# Patient Record
Sex: Female | Born: 1985 | Race: White | Hispanic: No | Marital: Single | Smoking: Current every day smoker
Health system: Southern US, Community
[De-identification: ages and names within clinical notes are randomized; demographics above are authoritative.]

## PROBLEM LIST (undated history)

## (undated) HISTORY — PX: ABDOMINAL HYSTERECTOMY: SHX81

---

## 2004-12-01 ENCOUNTER — Ambulatory Visit: Payer: Self-pay | Admitting: Family Medicine

## 2004-12-30 ENCOUNTER — Ambulatory Visit: Payer: Self-pay | Admitting: Family Medicine

## 2005-02-05 ENCOUNTER — Ambulatory Visit: Payer: Self-pay | Admitting: Family Medicine

## 2005-05-21 ENCOUNTER — Ambulatory Visit: Payer: Self-pay | Admitting: Family Medicine

## 2005-06-22 ENCOUNTER — Ambulatory Visit: Payer: Self-pay | Admitting: Family Medicine

## 2005-09-18 ENCOUNTER — Ambulatory Visit: Payer: Self-pay | Admitting: Family Medicine

## 2005-10-15 ENCOUNTER — Ambulatory Visit: Payer: Self-pay | Admitting: Family Medicine

## 2007-03-18 ENCOUNTER — Ambulatory Visit: Payer: Self-pay | Admitting: Family Medicine

## 2014-12-17 ENCOUNTER — Telehealth: Payer: Self-pay | Admitting: Family Medicine

## 2014-12-17 NOTE — Telephone Encounter (Signed)
Patient aware that our 1st new patient appointment is not until the end of April. Appointment scheduled for 4/19 at 9:10 with Christy.

## 2015-01-22 ENCOUNTER — Ambulatory Visit: Payer: Self-pay | Admitting: Family

## 2015-06-12 ENCOUNTER — Emergency Department (HOSPITAL_COMMUNITY)
Admission: EM | Admit: 2015-06-12 | Discharge: 2015-06-12 | Disposition: A | Discharge status: Home / Self Care | Payer: Self-pay

## 2017-06-09 ENCOUNTER — Ambulatory Visit: Payer: Self-pay | Admitting: Family

## 2017-06-10 ENCOUNTER — Encounter: Payer: Self-pay | Admitting: Family

## 2019-06-16 ENCOUNTER — Encounter (HOSPITAL_COMMUNITY): Payer: Self-pay | Admitting: *Deleted

## 2019-06-16 ENCOUNTER — Emergency Department (HOSPITAL_COMMUNITY): Payer: No Typology Code available for payment source

## 2019-06-16 ENCOUNTER — Emergency Department (HOSPITAL_COMMUNITY)
Admission: EM | Admit: 2019-06-16 | Discharge: 2019-06-16 | Disposition: A | Discharge status: Home / Self Care | Payer: No Typology Code available for payment source | Attending: Emergency Medicine | Admitting: Emergency Medicine

## 2019-06-16 ENCOUNTER — Other Ambulatory Visit: Payer: Self-pay

## 2019-06-16 DIAGNOSIS — S0181XA Laceration without foreign body of other part of head, initial encounter: Secondary | ICD-10-CM | POA: Insufficient documentation

## 2019-06-16 DIAGNOSIS — F17200 Nicotine dependence, unspecified, uncomplicated: Secondary | ICD-10-CM | POA: Insufficient documentation

## 2019-06-16 DIAGNOSIS — S0990XA Unspecified injury of head, initial encounter: Secondary | ICD-10-CM | POA: Diagnosis present

## 2019-06-16 DIAGNOSIS — S02651A Fracture of angle of right mandible, initial encounter for closed fracture: Secondary | ICD-10-CM | POA: Insufficient documentation

## 2019-06-16 DIAGNOSIS — Y92410 Unspecified street and highway as the place of occurrence of the external cause: Secondary | ICD-10-CM | POA: Insufficient documentation

## 2019-06-16 DIAGNOSIS — Y9389 Activity, other specified: Secondary | ICD-10-CM | POA: Insufficient documentation

## 2019-06-16 DIAGNOSIS — Y998 Other external cause status: Secondary | ICD-10-CM | POA: Diagnosis not present

## 2019-06-16 LAB — CBC WITH DIFFERENTIAL/PLATELET
Abs Immature Granulocytes: 0.05 K/uL (ref 0.00–0.07)
Basophils Absolute: 0.1 K/uL (ref 0.0–0.1)
Basophils Relative: 0 %
Eosinophils Absolute: 0.3 K/uL (ref 0.0–0.5)
Eosinophils Relative: 2 %
HCT: 36.3 % (ref 36.0–46.0)
Hemoglobin: 11.4 g/dL — ABNORMAL LOW (ref 12.0–15.0)
Immature Granulocytes: 0 %
Lymphocytes Relative: 28 %
Lymphs Abs: 3.5 K/uL (ref 0.7–4.0)
MCH: 25.6 pg — ABNORMAL LOW (ref 26.0–34.0)
MCHC: 31.4 g/dL (ref 30.0–36.0)
MCV: 81.4 fL (ref 80.0–100.0)
Monocytes Absolute: 0.8 K/uL (ref 0.1–1.0)
Monocytes Relative: 7 %
Neutro Abs: 7.8 K/uL — ABNORMAL HIGH (ref 1.7–7.7)
Neutrophils Relative %: 63 %
Platelets: 290 K/uL (ref 150–400)
RBC: 4.46 MIL/uL (ref 3.87–5.11)
RDW: 15.7 % — ABNORMAL HIGH (ref 11.5–15.5)
WBC: 12.4 K/uL — ABNORMAL HIGH (ref 4.0–10.5)
nRBC: 0 % (ref 0.0–0.2)

## 2019-06-16 LAB — BASIC METABOLIC PANEL WITH GFR
Anion gap: 7 (ref 5–15)
BUN: 18 mg/dL (ref 6–20)
CO2: 27 mmol/L (ref 22–32)
Calcium: 9.7 mg/dL (ref 8.9–10.3)
Chloride: 101 mmol/L (ref 98–111)
Creatinine, Ser: 0.92 mg/dL (ref 0.44–1.00)
GFR calc Af Amer: >60 mL/min
GFR calc non Af Amer: >60 mL/min
Glucose, Bld: 74 mg/dL (ref 70–99)
Potassium: 4.3 mmol/L (ref 3.5–5.1)
Sodium: 135 mmol/L (ref 135–145)

## 2019-06-16 LAB — HCG, QUANTITATIVE, PREGNANCY: hCG, Beta Chain, Quant, S: <1 m[IU]/mL

## 2019-06-16 LAB — ETHANOL: Alcohol, Ethyl (B): <10 mg/dL (ref <10)

## 2019-06-16 MED ORDER — ACETAMINOPHEN 500 MG PO TABS: 1000.0000 mg | ORAL_TABLET | Freq: Four times a day (QID) | ORAL | 0 refills | Status: AC | PRN

## 2019-06-16 MED ORDER — CEPHALEXIN 500 MG PO CAPS: 1000.0000 mg | ORAL_CAPSULE | Freq: Two times a day (BID) | ORAL | 0 refills | Status: AC

## 2019-06-16 MED ORDER — IBUPROFEN 800 MG PO TABS: 800.0000 mg | ORAL_TABLET | Freq: Three times a day (TID) | ORAL | 0 refills | Status: DC

## 2019-06-16 MED ORDER — CEFAZOLIN SODIUM-DEXTROSE 1-4 GM/50ML-% IV SOLN
1.0000 g | Freq: Once | INTRAVENOUS | Status: AC
  Administered 2019-06-16: 1 g via INTRAVENOUS
  Filled 2019-06-16: qty 50
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
## 2019-06-16 NOTE — ED Notes (Signed)
Pt stated she would stay to finish IV abx
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
## 2019-06-16 NOTE — Discharge Instructions (Addendum)
1.  You have a broken jaw.  You cannot chew any solid foods.  If you chew foods, your jaw will not heal and will require a surgical procedure. 2.  Schedule a follow-up with Dr. Janace Hoard in 1 week as planned. 3.  Take ibuprofen every 8 hours and Tylenol every 6 hours for pain control.  Gently apply well wrapped ice packs to your face for swelling and pain.  Try to sit with your head elevated about 30 degrees when you are sleeping or lying down.  This will help with swelling. 4.  Return to the emergency department if you are having difficulty swallowing, breathing, severe swelling, fever or other concerning symptoms. 5.  During your emergency department stay, you appeared to have drug intoxication.  We were not able to obtain a specimen to test for any drugs.  You must be aware that any drug use and driving is very dangerous and can result in severe death or injury to yourself or others.  If you have any problems with drug abuse, seek treatment.  See attached resource guide.

## 2019-06-16 NOTE — ED Notes (Signed)
Discharge paperwork reviewed with pt and mother in law. Pt ambulatory on discharge.
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
## 2021-10-15 ENCOUNTER — Emergency Department (HOSPITAL_COMMUNITY)
Admission: EM | Admit: 2021-10-15 | Discharge: 2021-10-15 | Disposition: A | Discharge status: Home / Self Care | Payer: Self-pay | Attending: Emergency Medicine | Admitting: Emergency Medicine

## 2021-10-15 ENCOUNTER — Other Ambulatory Visit: Payer: Self-pay

## 2021-10-15 DIAGNOSIS — R509 Fever, unspecified: Secondary | ICD-10-CM | POA: Insufficient documentation

## 2021-10-15 DIAGNOSIS — R110 Nausea: Secondary | ICD-10-CM | POA: Insufficient documentation

## 2021-10-15 DIAGNOSIS — L0231 Cutaneous abscess of buttock: Secondary | ICD-10-CM | POA: Insufficient documentation

## 2021-10-15 DIAGNOSIS — Z5321 Procedure and treatment not carried out due to patient leaving prior to being seen by health care provider: Secondary | ICD-10-CM | POA: Insufficient documentation

## 2021-10-15 LAB — COMPREHENSIVE METABOLIC PANEL
ALT: 35 U/L (ref 0–44)
AST: 41 U/L (ref 15–41)
Albumin: 2.5 g/dL — ABNORMAL LOW (ref 3.5–5.0)
Alkaline Phosphatase: 166 U/L — ABNORMAL HIGH (ref 38–126)
Anion gap: 10 (ref 5–15)
BUN: 67 mg/dL — ABNORMAL HIGH (ref 6–20)
CO2: 19 mmol/L — ABNORMAL LOW (ref 22–32)
Calcium: 8.7 mg/dL — ABNORMAL LOW (ref 8.9–10.3)
Chloride: 102 mmol/L (ref 98–111)
Creatinine, Ser: 1.7 mg/dL — ABNORMAL HIGH (ref 0.44–1.00)
GFR, Estimated: 39 mL/min — ABNORMAL LOW (ref >60)
Glucose, Bld: 150 mg/dL — ABNORMAL HIGH (ref 70–99)
Potassium: 2.9 mmol/L — ABNORMAL LOW (ref 3.5–5.1)
Sodium: 131 mmol/L — ABNORMAL LOW (ref 135–145)
Total Bilirubin: 0.5 mg/dL (ref 0.3–1.2)
Total Protein: 7.6 g/dL (ref 6.5–8.1)

## 2021-10-15 LAB — CBC WITH DIFFERENTIAL/PLATELET
Abs Immature Granulocytes: 0.3 10*3/uL — ABNORMAL HIGH (ref 0.00–0.07)
Basophils Absolute: 0 10*3/uL (ref 0.0–0.1)
Basophils Relative: 0 %
Eosinophils Absolute: 0.3 10*3/uL (ref 0.0–0.5)
Eosinophils Relative: 1 %
HCT: 25.1 % — ABNORMAL LOW (ref 36.0–46.0)
Hemoglobin: 8.4 g/dL — ABNORMAL LOW (ref 12.0–15.0)
Lymphocytes Relative: 10 %
Lymphs Abs: 3.3 10*3/uL (ref 0.7–4.0)
MCH: 26.8 pg (ref 26.0–34.0)
MCHC: 33.5 g/dL (ref 30.0–36.0)
MCV: 79.9 fL — ABNORMAL LOW (ref 80.0–100.0)
Metamyelocytes Relative: 1 %
Monocytes Absolute: 1.7 10*3/uL — ABNORMAL HIGH (ref 0.1–1.0)
Monocytes Relative: 5 %
Neutro Abs: 27.7 10*3/uL — ABNORMAL HIGH (ref 1.7–7.7)
Neutrophils Relative %: 83 %
Platelets: 498 10*3/uL — ABNORMAL HIGH (ref 150–400)
RBC: 3.14 MIL/uL — ABNORMAL LOW (ref 3.87–5.11)
RDW: 15.9 % — ABNORMAL HIGH (ref 11.5–15.5)
WBC: 33.4 10*3/uL — ABNORMAL HIGH (ref 4.0–10.5)
nRBC: 0 % (ref 0.0–0.2)
nRBC: 0 /100 WBC

## 2021-10-15 LAB — I-STAT BETA HCG BLOOD, ED (MC, WL, AP ONLY): I-stat hCG, quantitative: 6.7 m[IU]/mL — ABNORMAL HIGH (ref <5)

## 2021-10-15 LAB — LACTIC ACID, PLASMA: Lactic Acid, Venous: 1.5 mmol/L (ref 0.5–1.9)

## 2021-10-15 NOTE — ED Provider Triage Note (Signed)
Emergency Medicine Provider Triage Evaluation Note  Emily Rich , a 36 y.o. female  was evaluated in triage.  Pt complains of worsening cellulitis and abscess to the right buttock.  She states that her symptoms have been ongoing for the past week.  She states she has been evaluated at the [facility] emergency department multiple times for this.  She was started on clindamycin and has been compliant with this.  She states that she has continued to have worsening redness and drainage from the abscesses.  Reports subjective fevers as well as nausea.  No vomiting.  Reports a history of IVDA and states that she last used IV fentanyl 1 week ago.  Physical Exam  BP 136/84 (BP Location: Right Arm)    Pulse (!) 108    Temp 98 F (36.7 C) (Oral)    Resp 19    Ht 5\' 3"  (1.6 m)    Wt 54.4 kg    SpO2 100%    BMI 21.26 kg/m  Gen:   Awake, no distress   Resp:  Normal effort  MSK:   Moves extremities without difficulty  Other:    Medical Decision Making  Medically screening exam initiated at 3:21 AM.  Appropriate orders placed.  Alexandr Oehler was informed that the remainder of the evaluation will be completed by another provider, this initial triage assessment does not replace that evaluation, and the importance of remaining in the ED until their evaluation is complete.   Penni Bombard, PA-C 10/15/21 [phone]

## 2021-10-15 NOTE — ED Notes (Signed)
Pt still does not respond when called for vitals check 

## 2021-10-15 NOTE — ED Notes (Signed)
Pt did not respond when called for vitals check 

## 2021-10-15 NOTE — ED Triage Notes (Signed)
Pt c/o skin infection to right side of leg. Was prescribed ABX for what she believes was a MRSA infection. Verbalized compliance with ABX.

## 2021-10-20 LAB — CULTURE, BLOOD (ROUTINE X 2)
Culture: NO GROWTH
Culture: NO GROWTH
Special Requests: ADEQUATE
Special Requests: ADEQUATE

## 2022-06-03 ENCOUNTER — Other Ambulatory Visit: Payer: Self-pay

## 2022-06-03 ENCOUNTER — Encounter (HOSPITAL_COMMUNITY): Payer: Self-pay | Admitting: *Deleted

## 2022-06-03 ENCOUNTER — Emergency Department (HOSPITAL_COMMUNITY)
Admission: EM | Admit: 2022-06-03 | Discharge: 2022-06-03 | Disposition: A | Discharge status: Home / Self Care | Payer: Self-pay | Attending: Emergency Medicine | Admitting: Emergency Medicine

## 2022-06-03 ENCOUNTER — Emergency Department (HOSPITAL_COMMUNITY): Payer: Self-pay

## 2022-06-03 DIAGNOSIS — D649 Anemia, unspecified: Secondary | ICD-10-CM | POA: Insufficient documentation

## 2022-06-03 DIAGNOSIS — R0789 Other chest pain: Secondary | ICD-10-CM | POA: Insufficient documentation

## 2022-06-03 DIAGNOSIS — D72829 Elevated white blood cell count, unspecified: Secondary | ICD-10-CM | POA: Insufficient documentation

## 2022-06-03 DIAGNOSIS — R1012 Left upper quadrant pain: Secondary | ICD-10-CM | POA: Insufficient documentation

## 2022-06-03 LAB — COMPREHENSIVE METABOLIC PANEL
ALT: 29 U/L (ref 0–44)
AST: 26 U/L (ref 15–41)
Albumin: 3.8 g/dL (ref 3.5–5.0)
Alkaline Phosphatase: 117 U/L (ref 38–126)
Anion gap: 8 (ref 5–15)
BUN: 14 mg/dL (ref 6–20)
CO2: 27 mmol/L (ref 22–32)
Calcium: 9.3 mg/dL (ref 8.9–10.3)
Chloride: 100 mmol/L (ref 98–111)
Creatinine, Ser: 0.7 mg/dL (ref 0.44–1.00)
GFR, Estimated: >60 mL/min (ref >60)
Glucose, Bld: 100 mg/dL — ABNORMAL HIGH (ref 70–99)
Potassium: 3.9 mmol/L (ref 3.5–5.1)
Sodium: 135 mmol/L (ref 135–145)
Total Bilirubin: 0.2 mg/dL — ABNORMAL LOW (ref 0.3–1.2)
Total Protein: 8.6 g/dL — ABNORMAL HIGH (ref 6.5–8.1)

## 2022-06-03 LAB — CBC WITH DIFFERENTIAL/PLATELET
Abs Immature Granulocytes: 0.03 10*3/uL (ref 0.00–0.07)
Basophils Absolute: 0 10*3/uL (ref 0.0–0.1)
Basophils Relative: 0 %
Eosinophils Absolute: 0.1 10*3/uL (ref 0.0–0.5)
Eosinophils Relative: 1 %
HCT: 33.2 % — ABNORMAL LOW (ref 36.0–46.0)
Hemoglobin: 10.2 g/dL — ABNORMAL LOW (ref 12.0–15.0)
Immature Granulocytes: 0 %
Lymphocytes Relative: 30 %
Lymphs Abs: 3.3 10*3/uL (ref 0.7–4.0)
MCH: 21.9 pg — ABNORMAL LOW (ref 26.0–34.0)
MCHC: 30.7 g/dL (ref 30.0–36.0)
MCV: 71.4 fL — ABNORMAL LOW (ref 80.0–100.0)
Monocytes Absolute: 0.6 10*3/uL (ref 0.1–1.0)
Monocytes Relative: 6 %
Neutro Abs: 7.2 10*3/uL (ref 1.7–7.7)
Neutrophils Relative %: 63 %
Platelets: 384 10*3/uL (ref 150–400)
RBC: 4.65 MIL/uL (ref 3.87–5.11)
RDW: 18.6 % — ABNORMAL HIGH (ref 11.5–15.5)
WBC: 11.2 10*3/uL — ABNORMAL HIGH (ref 4.0–10.5)
nRBC: 0 % (ref 0.0–0.2)

## 2022-06-03 LAB — LIPASE, BLOOD: Lipase: 32 U/L (ref 11–51)

## 2022-06-03 MED ORDER — IOHEXOL 300 MG/ML  SOLN
100.0000 mL | Freq: Once | INTRAMUSCULAR | Status: AC | PRN
  Administered 2022-06-03: 80 mL via INTRAVENOUS

## 2022-06-03 MED ORDER — IBUPROFEN 800 MG PO TABS
800.0000 mg | ORAL_TABLET | Freq: Once | ORAL | Status: AC
  Administered 2022-06-03: 800 mg via ORAL
  Filled 2022-06-03: qty 1

## 2022-06-03 MED ORDER — IBUPROFEN 600 MG PO TABS: 600.0000 mg | ORAL_TABLET | Freq: Four times a day (QID) | ORAL | 0 refills | Status: AC | PRN

## 2022-06-03 NOTE — ED Notes (Signed)
Patient transported to X-ray 
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
## 2022-06-03 NOTE — ED Notes (Signed)
Unsuccessful IV/blood draw attempt.  Phlebotomy asked to attempt.

## 2022-06-03 NOTE — ED Notes (Signed)
ED Provider at bedside. 
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
## 2023-01-01 DIAGNOSIS — R0902 Hypoxemia: Secondary | ICD-10-CM | POA: Diagnosis not present

## 2023-01-01 DIAGNOSIS — M25571 Pain in right ankle and joints of right foot: Secondary | ICD-10-CM | POA: Diagnosis not present

## 2023-01-01 DIAGNOSIS — W010XXA Fall on same level from slipping, tripping and stumbling without subsequent striking against object, initial encounter: Secondary | ICD-10-CM | POA: Diagnosis not present

## 2023-01-01 DIAGNOSIS — M79673 Pain in unspecified foot: Secondary | ICD-10-CM | POA: Diagnosis not present

## 2023-01-01 DIAGNOSIS — S96911A Strain of unspecified muscle and tendon at ankle and foot level, right foot, initial encounter: Secondary | ICD-10-CM | POA: Diagnosis not present

## 2023-01-01 DIAGNOSIS — I959 Hypotension, unspecified: Secondary | ICD-10-CM | POA: Diagnosis not present

## 2023-01-01 DIAGNOSIS — S93401A Sprain of unspecified ligament of right ankle, initial encounter: Secondary | ICD-10-CM | POA: Diagnosis not present

## 2023-06-11 DIAGNOSIS — T887XXA Unspecified adverse effect of drug or medicament, initial encounter: Secondary | ICD-10-CM | POA: Diagnosis not present

## 2023-06-11 DIAGNOSIS — R55 Syncope and collapse: Secondary | ICD-10-CM | POA: Diagnosis not present

## 2023-06-11 DIAGNOSIS — R404 Transient alteration of awareness: Secondary | ICD-10-CM | POA: Diagnosis not present

## 2023-06-11 DIAGNOSIS — R0902 Hypoxemia: Secondary | ICD-10-CM | POA: Diagnosis not present

## 2023-06-11 DIAGNOSIS — R000 Tachycardia, unspecified: Secondary | ICD-10-CM | POA: Diagnosis not present

## 2024-06-01 DIAGNOSIS — R160 Hepatomegaly, not elsewhere classified: Secondary | ICD-10-CM | POA: Diagnosis not present

## 2024-06-01 DIAGNOSIS — R4182 Altered mental status, unspecified: Secondary | ICD-10-CM | POA: Diagnosis not present

## 2024-06-01 DIAGNOSIS — R7989 Other specified abnormal findings of blood chemistry: Secondary | ICD-10-CM | POA: Diagnosis not present

## 2024-06-01 DIAGNOSIS — F1120 Opioid dependence, uncomplicated: Secondary | ICD-10-CM | POA: Diagnosis not present

## 2024-06-01 DIAGNOSIS — T50901A Poisoning by unspecified drugs, medicaments and biological substances, accidental (unintentional), initial encounter: Secondary | ICD-10-CM | POA: Diagnosis not present

## 2024-06-01 DIAGNOSIS — R749 Abnormal serum enzyme level, unspecified: Secondary | ICD-10-CM | POA: Diagnosis not present

## 2024-06-01 DIAGNOSIS — S62617A Displaced fracture of proximal phalanx of left little finger, initial encounter for closed fracture: Secondary | ICD-10-CM | POA: Diagnosis not present

## 2024-06-01 DIAGNOSIS — R0602 Shortness of breath: Secondary | ICD-10-CM | POA: Diagnosis not present

## 2024-06-01 DIAGNOSIS — D72829 Elevated white blood cell count, unspecified: Secondary | ICD-10-CM | POA: Diagnosis not present

## 2024-06-01 DIAGNOSIS — E872 Acidosis, unspecified: Secondary | ICD-10-CM | POA: Diagnosis not present

## 2024-06-01 DIAGNOSIS — F4310 Post-traumatic stress disorder, unspecified: Secondary | ICD-10-CM | POA: Diagnosis not present

## 2024-06-01 DIAGNOSIS — R748 Abnormal levels of other serum enzymes: Secondary | ICD-10-CM | POA: Diagnosis not present

## 2024-06-02 DIAGNOSIS — R160 Hepatomegaly, not elsewhere classified: Secondary | ICD-10-CM | POA: Diagnosis not present

## 2024-06-02 DIAGNOSIS — I517 Cardiomegaly: Secondary | ICD-10-CM | POA: Diagnosis not present

## 2024-06-02 DIAGNOSIS — S62617A Displaced fracture of proximal phalanx of left little finger, initial encounter for closed fracture: Secondary | ICD-10-CM | POA: Diagnosis not present

## 2024-06-02 DIAGNOSIS — R748 Abnormal levels of other serum enzymes: Secondary | ICD-10-CM | POA: Diagnosis not present
